# Patient Record
Sex: Female | Born: 1985 | Race: White | Hispanic: Yes | Marital: Single | Smoking: Never smoker
Health system: Southern US, Community
[De-identification: ages and names within clinical notes are randomized; demographics above are authoritative.]

## PROBLEM LIST (undated history)

## (undated) DIAGNOSIS — D649 Anemia, unspecified: Secondary | ICD-10-CM

## (undated) HISTORY — PX: APPENDECTOMY: SHX54

---

## 2008-07-01 ENCOUNTER — Ambulatory Visit (HOSPITAL_COMMUNITY): Admission: RE | Admit: 2008-07-01 | Discharge: 2008-07-01 | Payer: Self-pay | Admitting: Family Medicine

## 2008-07-08 ENCOUNTER — Ambulatory Visit: Payer: Self-pay | Admitting: Obstetrics & Gynecology

## 2008-07-22 ENCOUNTER — Ambulatory Visit: Payer: Self-pay | Admitting: Family Medicine

## 2008-07-22 ENCOUNTER — Encounter: Payer: Self-pay | Admitting: Family

## 2008-07-22 LAB — CONVERTED CEMR LAB
Chlamydia, DNA Probe: NEGATIVE
GC Probe Amp, Genital: NEGATIVE

## 2008-07-25 ENCOUNTER — Ambulatory Visit: Payer: Self-pay | Admitting: Obstetrics & Gynecology

## 2008-07-29 ENCOUNTER — Ambulatory Visit: Payer: Self-pay | Admitting: Obstetrics & Gynecology

## 2008-08-05 ENCOUNTER — Ambulatory Visit: Payer: Self-pay | Admitting: Family Medicine

## 2008-08-08 ENCOUNTER — Ambulatory Visit: Payer: Self-pay | Admitting: Obstetrics and Gynecology

## 2008-08-12 ENCOUNTER — Inpatient Hospital Stay (HOSPITAL_COMMUNITY): Admission: RE | Admit: 2008-08-12 | Discharge: 2008-08-14 | Payer: Self-pay | Admitting: Family Medicine

## 2008-08-12 ENCOUNTER — Ambulatory Visit: Payer: Self-pay | Admitting: Obstetrics & Gynecology

## 2008-10-02 ENCOUNTER — Ambulatory Visit: Payer: Self-pay | Admitting: Obstetrics and Gynecology

## 2008-12-10 ENCOUNTER — Emergency Department (HOSPITAL_COMMUNITY): Admission: EM | Admit: 2008-12-10 | Discharge: 2008-12-10 | Payer: Self-pay | Admitting: Emergency Medicine

## 2010-03-16 IMAGING — CR DG CHEST 1V PORT
1 series · 1 of 1 positions shown · non-contrast
Comparison: None

CLINICAL DATA: History given of rapid heart rate and chest pain.

PORTABLE CHEST - 1 VIEW

[AP]
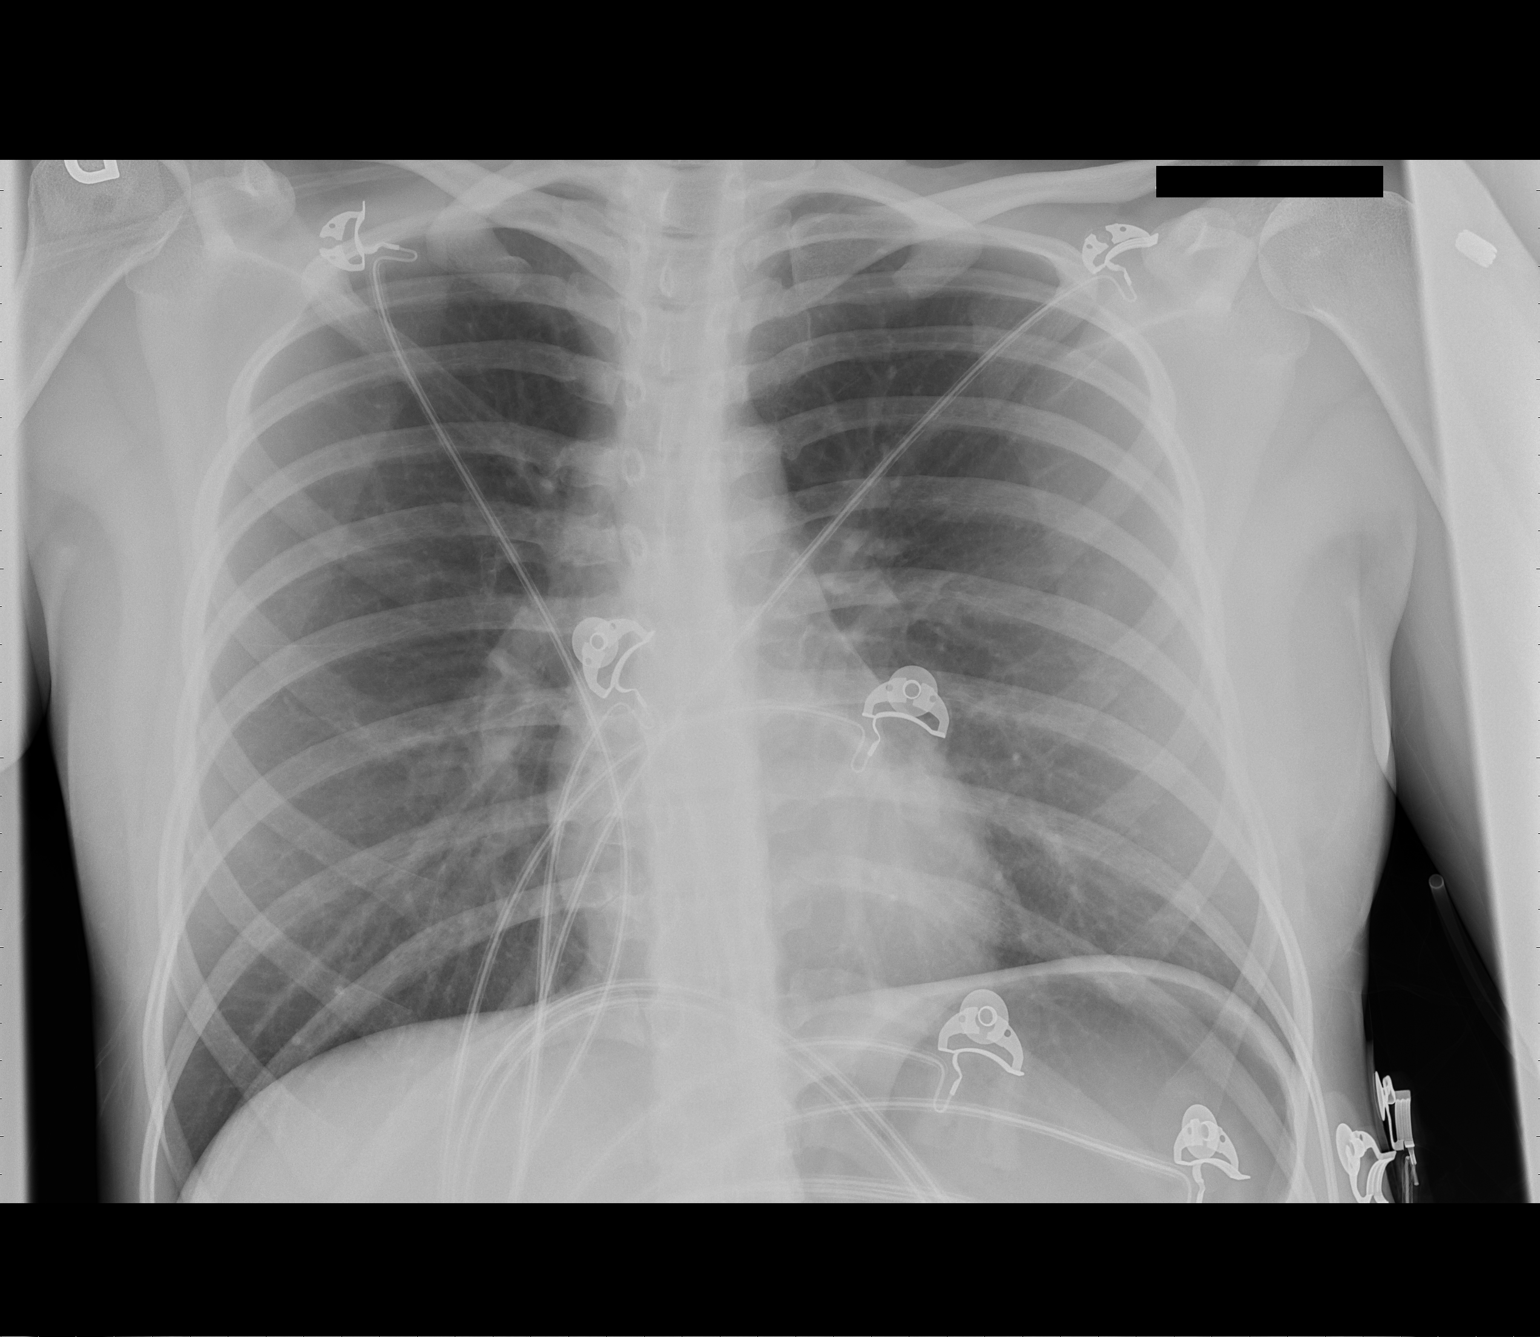

[1 of 1 positions shown; findings below may reference images not displayed]

FINDINGS: Cardiac silhouette is normal size and shape.  No pleural
disease is evident.  The lungs are free of infiltrates.  No
skeletal lesions are seen.  There is mild gaseous distention of the
stomach.
IMPRESSION: No acute or active cardiopulmonary process is identified.

## 2010-07-30 ENCOUNTER — Emergency Department (HOSPITAL_COMMUNITY): Admission: EM | Admit: 2010-07-30 | Discharge: 2010-03-23 | Payer: Self-pay | Admitting: Emergency Medicine

## 2010-09-13 ENCOUNTER — Encounter: Payer: Self-pay | Admitting: *Deleted

## 2010-11-06 LAB — URINALYSIS, ROUTINE W REFLEX MICROSCOPIC
Specific Gravity, Urine: 1.004 — ABNORMAL LOW (ref 1.005–1.030)
Urobilinogen, UA: 1 mg/dL (ref 0.0–1.0)

## 2010-11-06 LAB — BASIC METABOLIC PANEL
CO2: 26 mEq/L (ref 19–32)
Chloride: 102 mEq/L (ref 96–112)
GFR calc non Af Amer: >60 mL/min (ref >60)
Glucose, Bld: 103 mg/dL — ABNORMAL HIGH (ref 70–99)
Potassium: 3.7 mEq/L (ref 3.5–5.1)
Sodium: 137 mEq/L (ref 135–145)

## 2010-11-06 LAB — URINE MICROSCOPIC-ADD ON

## 2010-11-06 LAB — CBC
HCT: 37.8 % (ref 36.0–46.0)
Hemoglobin: 12.5 g/dL (ref 12.0–15.0)
MCHC: 33 g/dL (ref 30.0–36.0)
RDW: 15.6 % — ABNORMAL HIGH (ref 11.5–15.5)

## 2010-11-06 LAB — DIFFERENTIAL
Basophils Absolute: 0.1 10*3/uL (ref 0.0–0.1)
Eosinophils Relative: 1 % (ref 0–5)
Lymphs Abs: 3.3 10*3/uL (ref 0.7–4.0)
Monocytes Absolute: 1.2 10*3/uL — ABNORMAL HIGH (ref 0.1–1.0)
Monocytes Relative: 8 % (ref 3–12)
Neutro Abs: 10 10*3/uL — ABNORMAL HIGH (ref 1.7–7.7)

## 2010-12-02 LAB — RAPID URINE DRUG SCREEN, HOSP PERFORMED
Barbiturates: NOT DETECTED
Cocaine: POSITIVE — AB
Opiates: NOT DETECTED
Tetrahydrocannabinol: NOT DETECTED

## 2010-12-02 LAB — URINALYSIS, ROUTINE W REFLEX MICROSCOPIC
Bilirubin Urine: NEGATIVE
Ketones, ur: NEGATIVE mg/dL
Protein, ur: NEGATIVE mg/dL
Urobilinogen, UA: 1 mg/dL (ref 0.0–1.0)

## 2010-12-02 LAB — BASIC METABOLIC PANEL
CO2: 21 mEq/L (ref 19–32)
GFR calc Af Amer: >60 mL/min (ref >60)
Glucose, Bld: 125 mg/dL — ABNORMAL HIGH (ref 70–99)

## 2010-12-02 LAB — DIFFERENTIAL
Eosinophils Absolute: 0 10*3/uL (ref 0.0–0.7)
Eosinophils Relative: 0 % (ref 0–5)
Lymphocytes Relative: 21 % (ref 12–46)
Lymphs Abs: 1.6 10*3/uL (ref 0.7–4.0)
Monocytes Relative: 6 % (ref 3–12)
Neutrophils Relative %: 72 % (ref 43–77)

## 2010-12-02 LAB — URINE MICROSCOPIC-ADD ON

## 2010-12-02 LAB — POCT CARDIAC MARKERS
CKMB, poc: <1 ng/mL — ABNORMAL LOW (ref 1.0–8.0)
CKMB, poc: <1 ng/mL — ABNORMAL LOW (ref 1.0–8.0)
Myoglobin, poc: 22.2 ng/mL (ref 12–200)
Troponin i, poc: <0.05 ng/mL (ref 0.00–0.09)
Troponin i, poc: <0.05 ng/mL (ref 0.00–0.09)

## 2010-12-02 LAB — CBC: HCT: 42.4 % (ref 36.0–46.0)

## 2010-12-02 LAB — D-DIMER, QUANTITATIVE: D-Dimer, Quant: 0.82 ug/mL-FEU — ABNORMAL HIGH (ref 0.00–0.48)

## 2010-12-02 LAB — POCT PREGNANCY, URINE: Preg Test, Ur: NEGATIVE

## 2011-01-05 NOTE — Discharge Summary (Signed)
NAMESHERRAN, MARGOLIS      ACCOUNT NO.:  [id number]   MEDICAL RECORD NO.:  [id number]          PATIENT TYPE:  INP   LOCATION:  9120                          FACILITY:  [facility]   PHYSICIAN:  Tanya S. Shawnie Pons, M.D.   DATE OF BIRTH:  October 08, 1985   DATE OF ADMISSION:  08/12/2008  DATE OF DISCHARGE:  08/14/2008                               DISCHARGE SUMMARY   DIAGNOSIS AT ADMISSION:  Elective repeat low-transverse cesarean  section.   DIAGNOSIS AT DISCHARGE:  Term pregnancy delivered by low-transverse  cesarean section.   PROCEDURES DONE:  Nonstress test, low-transverse cesarean section.   HISTORY AND BRIEF HOSPITAL COURSE:  The patient is a 25 year old G3, P  now 3-0-0-3 at 39 weeks who elected for low-transverse cesarean section  went on to deliver a viable female at 7 pounds 0 ounces and 20 inches  long with Apgars of 9 at 1 minute and 9 at 5 minutes, had a uneventful  recovery and by discharge, was ambulatory, tolerating p.o. diet, voiding  freely and passing flatus.  Pain was controlled and lochia had subsided.   LABORATORY DATA:  Blood type A positive.  Antibody screen negative.  Rubella immune.  Hepatitis B surface antigen negative.  RPR nonreactive.  GC negative.  Chlamydia negative.  HIV nonreactive.  Glucose tolerance  test was 87 at 1 hour and GBS was negative.   ACTIVITY:  Pelvic rest for 6 weeks and no lifting of greater than 10  pounds.   DIET:  Routine.   MEDICATIONS AT DISCHARGE:  1. Prenatal vitamins 1 tablet p.o. daily.  2. Ibuprofen 600 mg p.o. q.6 h. p.r.n. pain.  3. Colace 100 mg p.o. b.i.d. p.r.n. constipation.  4. Percocet 5/325 one tablet p.o. q.6 h. p.r.n. pain.   DISCHARGE CONDITION:  Stable.   DISCHARGE DISPOSITION:  To home.   FOLLOWUP:  Followup will be in 6 weeks at the [facility]  Department.      Rodney Langton, MD  Electronically Signed     ______________________________  Shelbie Proctor. Shawnie Pons, M.D.    TT/MEDQ  D:   08/14/2008  T:  08/14/2008  Job:  [id number]

## 2011-01-05 NOTE — Op Note (Signed)
NAME:  Courtney Wang, Courtney Wang       ACCOUNT NO.:  [id number]   MEDICAL RECORD NO.:  [id number]          PATIENT TYPE:  INP   LOCATION:  9120                          [facility]ACILITY:  [facility]   PHYSICIAN:  Allie Bossier, MD        DATE OF BIRTH:  1985-11-09   DATE OF PROCEDURE:  DATE OF DISCHARGE:                               OPERATIVE REPORT   PREOPERATIVE DIAGNOSES:  1. Intrauterine pregnancy at 39 weeks.  2. History of previous cesarean section.   POSTOPERATIVE DIAGNOSES:  1. Intrauterine pregnancy at 39 weeks.  2. History of previous cesarean section.   PROCEDURE:  Repeat low transverse cesarean section.   SURGEON:  Myra C. Marice Potter, MD   ASSISTANT:  Odie Sera, DO   ANESTHESIA:  Spinal.   INDICATIONS FOR SURGERY:  Ms. Stahle is a 25 year old, gravida  3, now para 3-0-0-3, at 39-2/7th weeks for elective repeat low  transverse cesarean section.  She has had a previous cesarean section  secondary to fetal distress.  Her prenatal course has been complicated  by polyhydramnios, otherwise has been without complications.  She has  previously been counseled on the risks and benefits of repeat cesarean  section to include, but not limited to bleeding, infection, and damage  to internal organs.  The patient voices understanding of the  complications and desires to proceed with a repeat cesarean.   DESCRIPTION OF PROCEDURE:  The patient was taken to the operating room  where a spinal anesthesia was introduced.  She was prepped and draped in  the usual fashion and placed in the left dorsal supine position.  A time-  out was conducted.  A Pfannenstiel incision was made in the skin with a  scalpel and extended down to the fascial layer.  The fascia was incised  in the midline with the scalpel.  The fascial incision was extended  laterally with the Mayo scissors.  The rectus muscles were dissected in  the midline with electrocautery.  The rectus opening was extended  manually.  The  peritoneum was entered bluntly with a hemostat.  The  peritoneal opening was extended with electrocautery.  Adequate opening  to the uterus was obtained.  The bladder blade was placed.  A transverse  incision was made in the lower uterine segment with the scalpel.  This  was continued through the myometrium.  The final layers were entered  bluntly with a hemostat.  Copious amounts of clear amniotic fluid were  noted.  The uterine incision was extended laterally with manual  traction.  The bladder blade was removed.  The head was grasped,  elevated out of pelvis.  The head was delivered with the assistance of  fundal pressure.  The mouth and nares were bulb suctioned.  The  shoulders and rest of the corpus were delivered without complications.  The mouth and nares were bulb suctioned again.  The cord was clamped and  cut, and the baby was handed to the awaiting NICU staff.  Placenta  delivered with the assistance of uterine massage.  Placenta was intact  and a 3-vessel cord.  The baby's Apgars were 9 and 9  and weight was 7  pounds 0 ounces.  The uterus was cleared of debris and clots with a dry  lap.  The uterine incision was closed using 0 chromic in a running  interlocking fashion.  Good hemostasis was noted.  Bilateral adnexa were  identified and found to be grossly normal.  The fascia was then closed  with looped PDS suture in a running noninterlocking fashion.  Good  hemostasis was noted.  There were no defects noted in the fascia.  The  subcutaneous tissues were irrigated and injected with 0.25% Marcaine  approximately 30 mL.  The skin was then closed with 3-0 Vicryl in  running subcutaneous manner.  Steri-Strips were applied.   FINDINGS:  1. Clear amniotic fluid.  2. Viable female infant with Apgars 9 and 9, weight 7 pounds 0 ounces.  3. Grossly normal adnexa bilaterally.   SPECIMENS:  1. Cord blood.  2. Placenta.   DISPOSITION:  Cord blood to the lab.  Placenta to L and  D.   ESTIMATED BLOOD LOSS:  600 mL.   COMPLICATIONS:  None.  The patient was taken to the PACU in good  condition.      Odie Sera, DO  Electronically Signed     ______________________________  Allie Bossier, MD    MC/MEDQ  D:  08/12/2008  T:  08/12/2008  Job:  [id number]

## 2011-05-25 LAB — POCT URINALYSIS DIP (DEVICE)
Bilirubin Urine: NEGATIVE
Hgb urine dipstick: NEGATIVE
Ketones, ur: NEGATIVE
Nitrite: NEGATIVE
Operator id: 148111
Urobilinogen, UA: 0.2
pH: 7
pH: 7.5

## 2011-05-28 LAB — TYPE AND SCREEN
ABO/RH(D): A POS
Antibody Screen: NEGATIVE

## 2011-05-28 LAB — POCT URINALYSIS DIP (DEVICE)
Bilirubin Urine: NEGATIVE
Glucose, UA: NEGATIVE mg/dL
Glucose, UA: NEGATIVE mg/dL
Hgb urine dipstick: NEGATIVE
Ketones, ur: NEGATIVE mg/dL
Nitrite: NEGATIVE
Protein, ur: NEGATIVE mg/dL
Urobilinogen, UA: 0.2 mg/dL (ref 0.0–1.0)
pH: 7 (ref 5.0–8.0)
pH: 7 (ref 5.0–8.0)

## 2011-05-28 LAB — CBC
Hemoglobin: 11.3 g/dL — ABNORMAL LOW (ref 12.0–15.0)
MCHC: 34.5 g/dL (ref 30.0–36.0)
Platelets: 221 10*3/uL (ref 150–400)
RDW: 14.9 % (ref 11.5–15.5)
RDW: 15.1 % (ref 11.5–15.5)
WBC: 12.3 10*3/uL — ABNORMAL HIGH (ref 4.0–10.5)

## 2011-05-28 LAB — ABO/RH: ABO/RH(D): A POS

## 2011-06-27 IMAGING — CT CT HEAD W/O CM
2 series · 15 of 30 positions shown, 19 images · non-contrast
Comparison: None.

CLINICAL DATA: Bilateral hand and facial numbness.

CT HEAD WITHOUT CONTRAST
TECHNIQUE: Contiguous axial images were obtained from the base of
the skull through the vertex without contrast.

[Series 2: brain · axial · 0.49mm/px · z∈[+134,+240]mm · 13 of 48 slices shown, 17 images]
[im 4/48  brain]
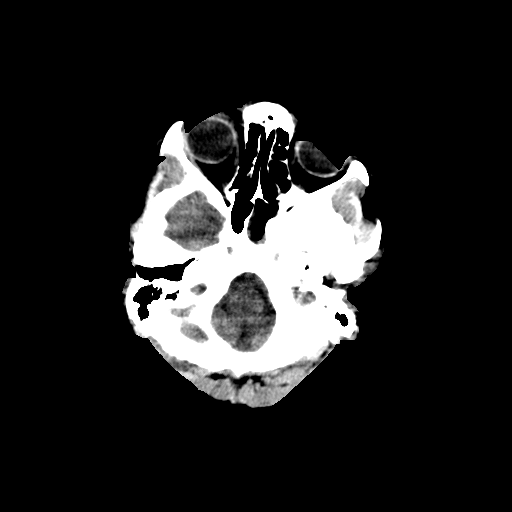
[im 4/48  bone]
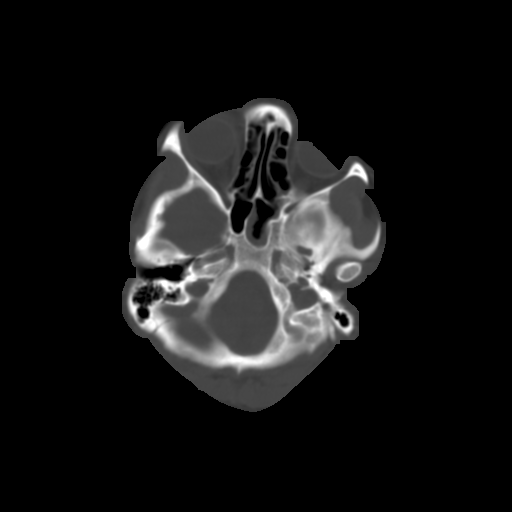
[im 7/48  brain]
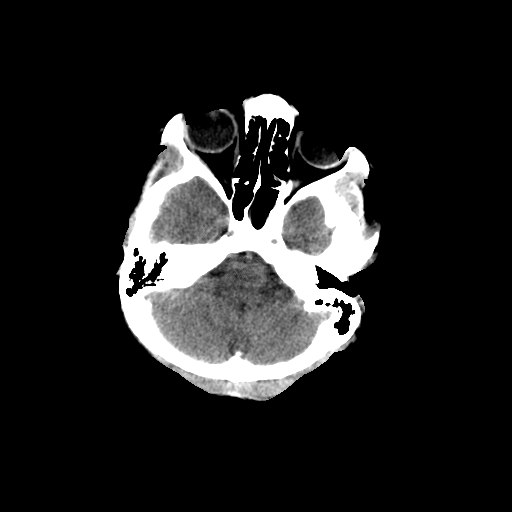
[im 11/48  brain]
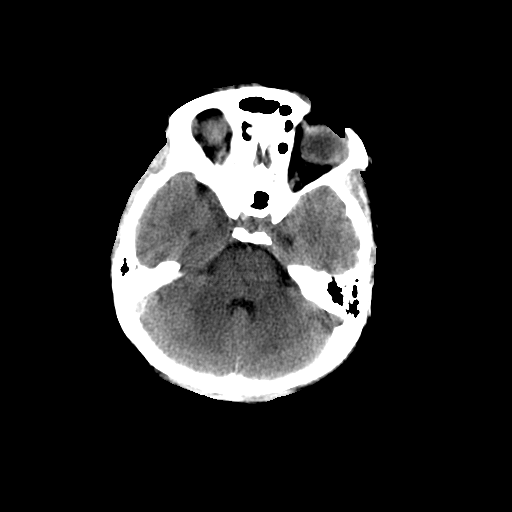
[im 14/48  brain]
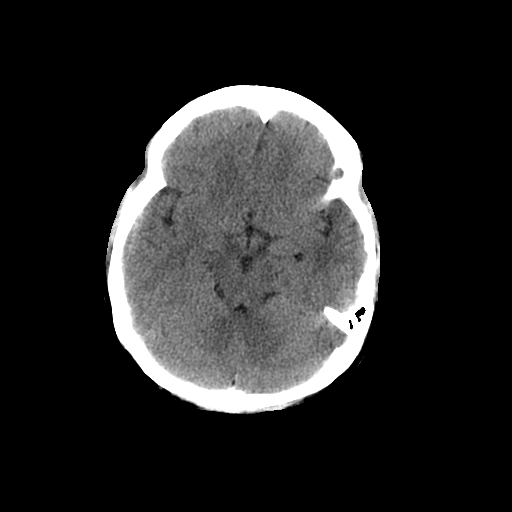
[im 17/48  brain]
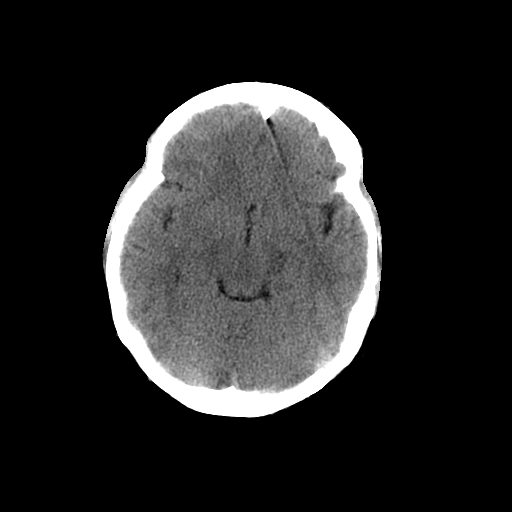
[im 17/48  bone]
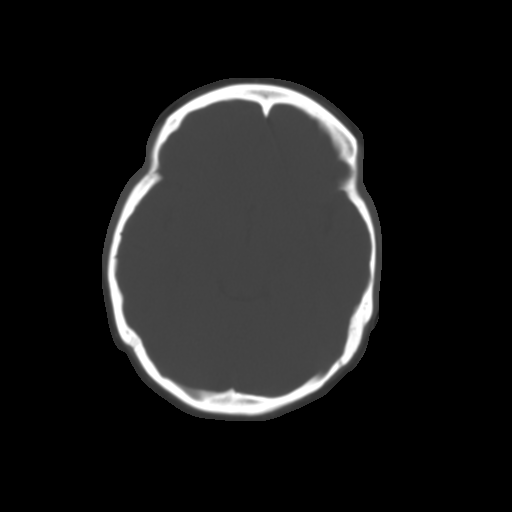
[im 21/48  brain]
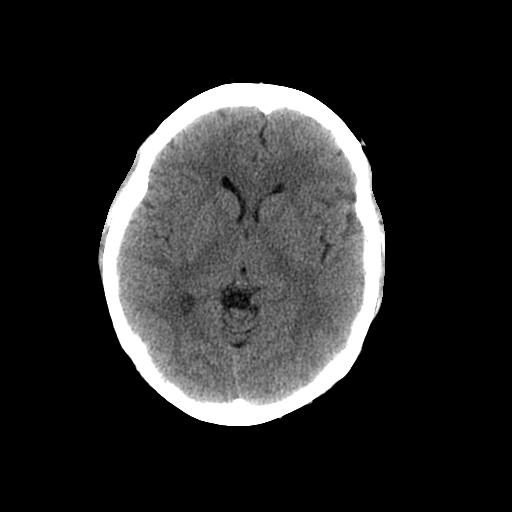
[im 24/48  brain]
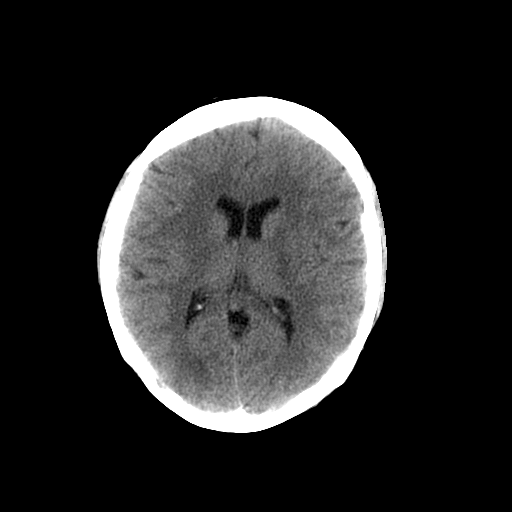
[im 27/48  brain]
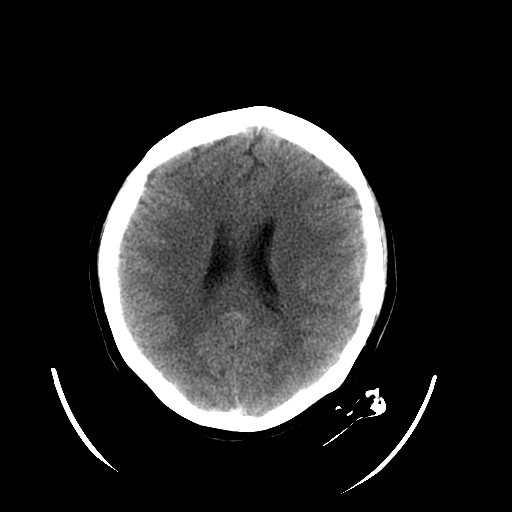
[im 31/48  brain]
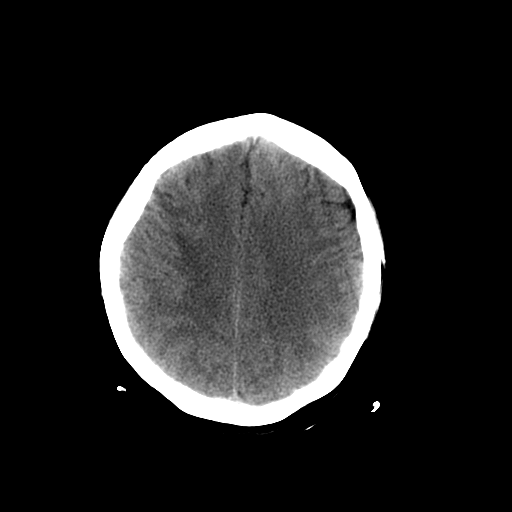
[im 31/48  bone]
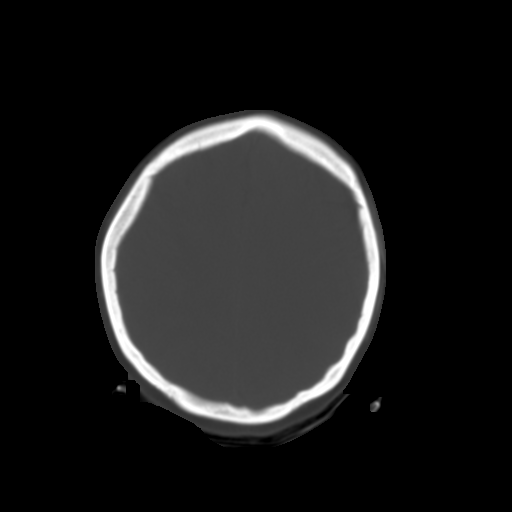
[im 34/48  brain]
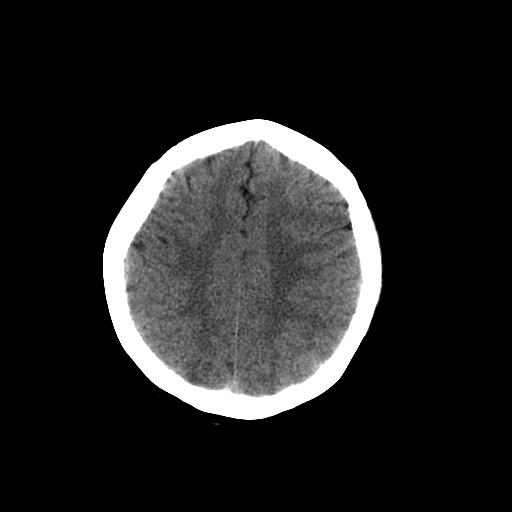
[im 37/48  brain]
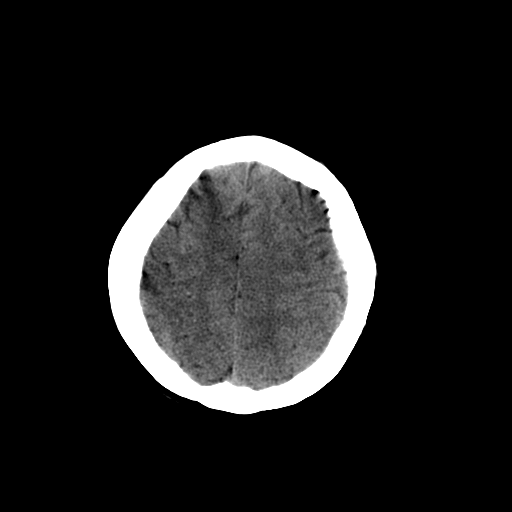
[im 41/48  brain]
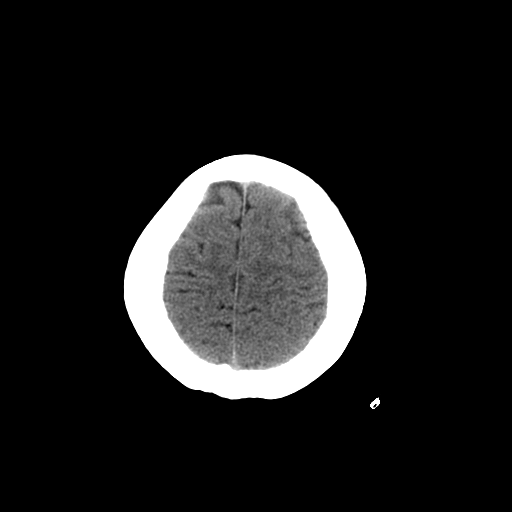
[im 44/48  brain]
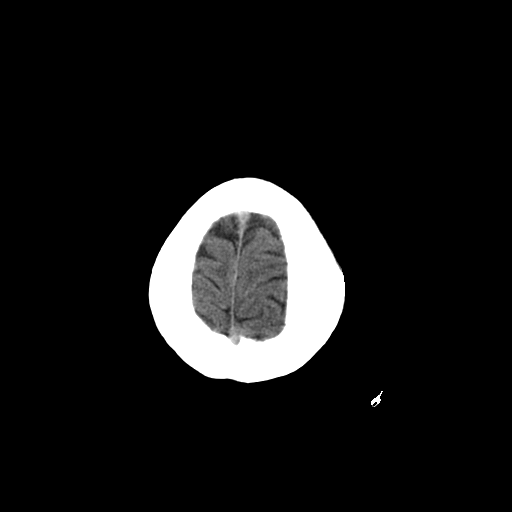
[im 44/48  bone]
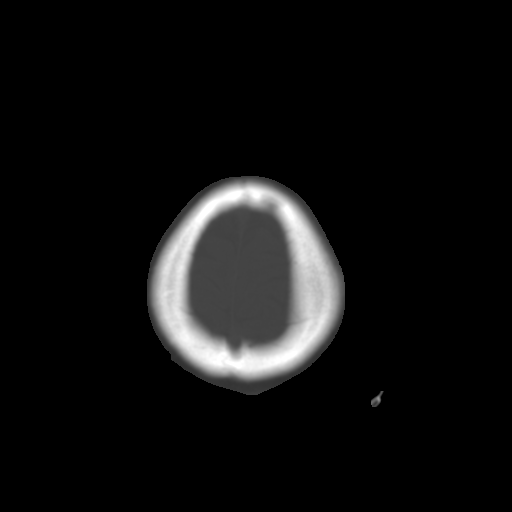

[Series 3: recon 2: brain · axial · 0.49mm/px · z∈[+134,+154]mm · 2 of 48 slices shown]
[im 4/48  brain]
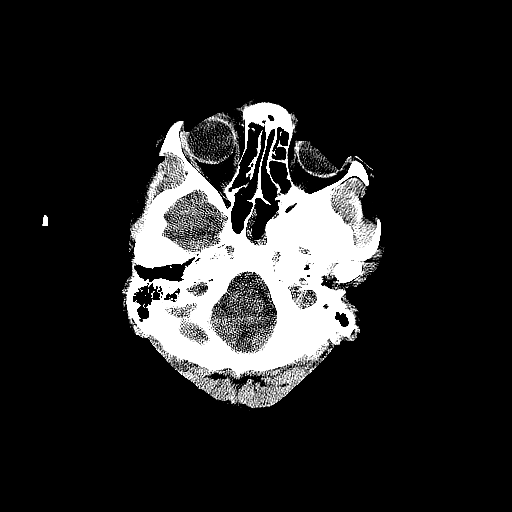
[im 11/48  brain]
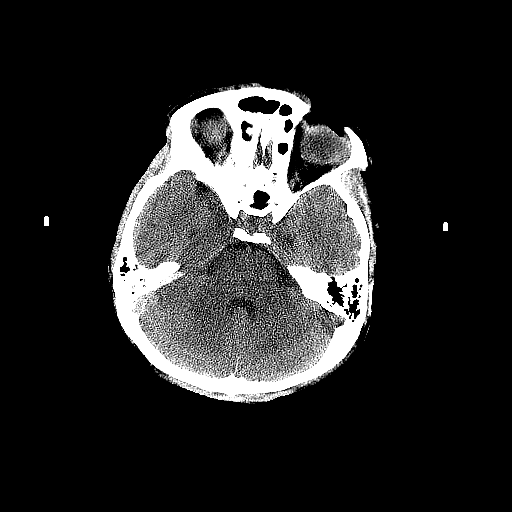

[15 of 30 positions shown; findings below may reference images not displayed]

FINDINGS: There is no evidence of acute infarction, mass lesion, or
intra- or extra-axial hemorrhage on CT. Evaluation is mildly
suboptimal due to motion artifact.

The posterior fossa, including the cerebellum, brainstem and fourth
ventricle, is within normal limits.  The third and lateral
ventricles, and basal ganglia are unremarkable in appearance.  The
cerebral hemispheres are symmetric in appearance, with normal gray-
white differentiation.  No mass effect or midline shift is seen.

There is no evidence of fracture; visualized osseous structures are
unremarkable in appearance.  The visualized portions of the orbits
are within normal limits.  The paranasal sinuses and mastoid air
cells are well-aerated.  There is hypoplasia of the frontal
sinuses.  No significant soft tissue abnormalities are seen.
IMPRESSION: Unremarkable noncontrast CT of the head.

## 2021-12-21 ENCOUNTER — Emergency Department (HOSPITAL_COMMUNITY)
Admission: EM | Admit: 2021-12-21 | Discharge: 2021-12-22 | Disposition: A | Discharge status: Home / Self Care | Payer: Self-pay | Attending: Emergency Medicine | Admitting: Emergency Medicine

## 2021-12-21 ENCOUNTER — Other Ambulatory Visit: Payer: Self-pay

## 2021-12-21 DIAGNOSIS — B3731 Acute candidiasis of vulva and vagina: Secondary | ICD-10-CM | POA: Insufficient documentation

## 2021-12-21 DIAGNOSIS — R21 Rash and other nonspecific skin eruption: Secondary | ICD-10-CM | POA: Insufficient documentation

## 2021-12-21 DIAGNOSIS — R5383 Other fatigue: Secondary | ICD-10-CM | POA: Insufficient documentation

## 2021-12-21 DIAGNOSIS — B9689 Other specified bacterial agents as the cause of diseases classified elsewhere: Secondary | ICD-10-CM | POA: Insufficient documentation

## 2021-12-21 DIAGNOSIS — A6004 Herpesviral vulvovaginitis: Secondary | ICD-10-CM

## 2021-12-21 DIAGNOSIS — N760 Acute vaginitis: Secondary | ICD-10-CM | POA: Insufficient documentation

## 2021-12-21 DIAGNOSIS — R0602 Shortness of breath: Secondary | ICD-10-CM | POA: Insufficient documentation

## 2021-12-21 DIAGNOSIS — E876 Hypokalemia: Secondary | ICD-10-CM | POA: Insufficient documentation

## 2021-12-21 DIAGNOSIS — R519 Headache, unspecified: Secondary | ICD-10-CM | POA: Insufficient documentation

## 2021-12-21 DIAGNOSIS — E871 Hypo-osmolality and hyponatremia: Secondary | ICD-10-CM | POA: Insufficient documentation

## 2021-12-21 DIAGNOSIS — B0089 Other herpesviral infection: Secondary | ICD-10-CM | POA: Insufficient documentation

## 2021-12-21 LAB — CBC
HCT: 25.3 % — ABNORMAL LOW (ref 36.0–46.0)
Hemoglobin: 6.4 g/dL — CL (ref 12.0–15.0)
MCH: 15.8 pg — ABNORMAL LOW (ref 26.0–34.0)
MCHC: 25.3 g/dL — ABNORMAL LOW (ref 30.0–36.0)
MCV: 62.3 fL — ABNORMAL LOW (ref 80.0–100.0)
Platelets: 517 10*3/uL — ABNORMAL HIGH (ref 150–400)
RBC: 4.06 MIL/uL (ref 3.87–5.11)
RDW: 19.7 % — ABNORMAL HIGH (ref 11.5–15.5)
WBC: 13.4 10*3/uL — ABNORMAL HIGH (ref 4.0–10.5)
nRBC: 0 % (ref 0.0–0.2)

## 2021-12-21 LAB — URINALYSIS, ROUTINE W REFLEX MICROSCOPIC
Bilirubin Urine: NEGATIVE
Glucose, UA: NEGATIVE mg/dL
Hgb urine dipstick: NEGATIVE
Ketones, ur: NEGATIVE mg/dL
Nitrite: NEGATIVE
Protein, ur: NEGATIVE mg/dL
Specific Gravity, Urine: 1.014 (ref 1.005–1.030)
pH: 6 (ref 5.0–8.0)

## 2021-12-21 LAB — BASIC METABOLIC PANEL
Anion gap: 10 (ref 5–15)
BUN: 12 mg/dL (ref 6–20)
CO2: 22 mmol/L (ref 22–32)
Calcium: 9.6 mg/dL (ref 8.9–10.3)
Chloride: 102 mmol/L (ref 98–111)
Creatinine, Ser: 0.73 mg/dL (ref 0.44–1.00)
GFR, Estimated: >60 mL/min (ref >60)
Glucose, Bld: 104 mg/dL — ABNORMAL HIGH (ref 70–99)
Potassium: 3.4 mmol/L — ABNORMAL LOW (ref 3.5–5.1)
Sodium: 134 mmol/L — ABNORMAL LOW (ref 135–145)

## 2021-12-21 NOTE — ED Provider Triage Note (Signed)
Emergency Medicine Provider Triage Evaluation Note ? ?Courtney Wang , a 36 y.o. female  was evaluated in triage.  Pt complains of "pimples" on her vagina for the last 2 weeks.  Patient states that she has multiple pimples that drained yellow fluid on her vagina.  The patient reports that this has been going on for the last 2 weeks.  The patient denies any fevers however does endorse body aches and chills.  The patient endorses an episode of nausea however no vomiting.  Patient denies any pelvic pain, vaginal discharge. ? ?Review of Systems  ?Positive:  ?Negative:  ? ?Physical Exam  ?BP 123/76 (BP Location: Right Arm)   Pulse (!) 106   Temp 98.4 ?F (36.9 ?C) (Oral)   Resp 16   SpO2 100%  ?Gen:   Awake, no distress   ?Resp:  Normal effort  ?MSK:   Moves extremities without difficulty  ?Other:   ? ?Medical Decision Making  ?Medically screening exam initiated at 8:52 PM.  Appropriate orders placed.  Courtney Wang was informed that the remainder of the evaluation will be completed by another provider, this initial triage assessment does not replace that evaluation, and the importance of remaining in the ED until their evaluation is complete. ? ? ?  ?Al Decant, PA-C ?12/21/21 2053 ? ?

## 2021-12-21 NOTE — ED Triage Notes (Signed)
Pt presents reporting a one week hx of "blisters" around her vagina that are too painful to even sit down.  Also reports chills. No hx of this in the past.  ?

## 2021-12-22 LAB — WET PREP, GENITAL
Clue Cells Wet Prep HPF POC: NONE SEEN
Sperm: NONE SEEN
Trich, Wet Prep: NONE SEEN
WBC, Wet Prep HPF POC: >=10 — AB (ref <10)

## 2021-12-22 LAB — GC/CHLAMYDIA PROBE AMP (~~LOC~~) NOT AT ARMC
Chlamydia: NEGATIVE
Comment: NEGATIVE
Comment: NORMAL
Neisseria Gonorrhea: NEGATIVE

## 2021-12-22 LAB — PREGNANCY, URINE: Preg Test, Ur: NEGATIVE

## 2021-12-22 LAB — PREPARE RBC (CROSSMATCH)

## 2021-12-22 LAB — ABO/RH: ABO/RH(D): A POS

## 2021-12-22 MED ORDER — SODIUM CHLORIDE 0.9 % IV SOLN
1.0000 g | Freq: Once | INTRAVENOUS | Status: AC
  Administered 2021-12-22: 1 g via INTRAVENOUS
  Filled 2021-12-22: qty 10

## 2021-12-22 MED ORDER — DOXYCYCLINE HYCLATE 100 MG PO CAPS: 100.0000 mg | ORAL_CAPSULE | Freq: Two times a day (BID) | ORAL | 0 refills | Status: AC

## 2021-12-22 MED ORDER — DOXYCYCLINE HYCLATE 100 MG PO TABS
100.0000 mg | ORAL_TABLET | Freq: Once | ORAL | Status: AC
  Administered 2021-12-22: 100 mg via ORAL
  Filled 2021-12-22: qty 1

## 2021-12-22 MED ORDER — FLUCONAZOLE 150 MG PO TABS
150.0000 mg | ORAL_TABLET | Freq: Once | ORAL | Status: AC
  Administered 2021-12-22: 150 mg via ORAL
  Filled 2021-12-22: qty 1

## 2021-12-22 MED ORDER — SODIUM CHLORIDE 0.9 % IV SOLN
10.0000 mL/h | Freq: Once | INTRAVENOUS | Status: AC
  Administered 2021-12-22: 10 mL/h via INTRAVENOUS

## 2021-12-22 MED ORDER — FENTANYL CITRATE PF 50 MCG/ML IJ SOSY
50.0000 ug | PREFILLED_SYRINGE | Freq: Once | INTRAMUSCULAR | Status: AC
  Administered 2021-12-22: 50 ug via INTRAVENOUS
  Filled 2021-12-22: qty 1

## 2021-12-22 MED ORDER — VALACYCLOVIR HCL 500 MG PO TABS
1000.0000 mg | ORAL_TABLET | Freq: Once | ORAL | Status: AC
  Administered 2021-12-22: 1000 mg via ORAL
  Filled 2021-12-22: qty 2

## 2021-12-22 MED ORDER — VALACYCLOVIR HCL 1 G PO TABS: 1000.0000 mg | ORAL_TABLET | Freq: Two times a day (BID) | ORAL | 0 refills | Status: AC

## 2021-12-22 NOTE — Discharge Instructions (Addendum)
You were seen in the ER today for your discomfort in your vaginal area.  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  This is being treated with valacyclovir, which you should take 2 times a day for the next 2 weeks.  Additionally you are being treated with an antibiotic to cover for any other sexually transmitted intravaginal infections called doxycycline.  Please take this as prescribed for the entire course.  You were also treated in the emergency department with a dose of medication for a yeast infection in your vaginal area for which she tested positive. ? ?Follow-up with the gynecologist listed below.  Please call their office tomorrow to schedule follow-up appointment for evaluation for your very heavy periods; these will need to be appropriately managed in order to prevent recurrence of your profound anemia. ? ?Return to the ER if you develop any other new severe symptoms.  You may also use Tylenol and ibuprofen as needed for your vaginal discomfort. ? ?Usted fue vista en la sala de emergencias hoy por su malestar en el ?rea vaginal. Tiene una infecci?n de herpes, que es una infecci?n viral que causa ampollas en el ?rea vaginal. Esto se est? tratando con valaciclovir, que debe tomar 2 veces al d?a durante las pr?ximas 2 semanas. Adem?s, est? siendo tratada con un antibi?tico para cubrir cualquier otra infecci?n intravaginal de transmisi?n sexual llamada doxiciclina. Por favor, tome esto seg?n lo prescrito para todo el curso. Tambi?n te trataron en el departamento de emergencias con una dosis de medicamento para una infecci?n por hongos en tu ?rea vaginal en la que ella dio positivo. ? ?Seguimiento con el ginec?logo que se indica a continuaci?n. Llame a su oficina ma?ana para programar una cita de seguimiento para la evaluaci?n de sus per?odos muy abundantes; estos deber?n manejarse adecuadamente para prevenir la recurrencia de su anemia  profunda. ? ?Regrese a la sala de emergencias si desarrolla cualquier otro s?ntoma grave nuevo. Tambi?n puede usar Tylenol e ibuprofeno seg?n sea necesario para su malestar vaginal. ?

## 2021-12-22 NOTE — ED Notes (Signed)
Lab agreed to add on Pregnancy urine  ?

## 2021-12-22 NOTE — ED Notes (Signed)
Pt verbalizes understanding of discharge instructions. Opportunity for questions and answers were provided. Pt discharged from the ED.   ?

## 2021-12-22 NOTE — ED Provider Notes (Signed)
Care assumed from [place], [place] at shift change, please see their note for full detail, but in brief Courtney Wang is a 36 y.o. female presents with symptomatic anemia from vaginal bleeding. ? ?Plan: Discharge after blood transfusion ?Physical Exam  ?BP 103/70   Pulse 64   Temp 97.7 ?F (36.5 ?C) (Oral)   Resp 15   SpO2 100%  ? ?Physical Exam ? ?Procedures  ?Procedures ? ?ED Course / MDM  ? ?Clinical Course as of 12/22/21 0645  ?Tue Dec 22, 2021  ?[id number] Patient's first unit of blood is being transfused at this time; will require second unit and antibiotic therapy prior to discharge but plan is to discharge from the ED at this time. [RS]  ?  ?Clinical Course User Index ?[RS] Sponseller, Eugene Gavia, PA-C  ? ?Medical Decision Making ?Amount and/or Complexity of Data Reviewed ?Labs: ordered. ? ?Risk ?Prescription drug management. ? ? ?Pt received 2 units of prbc and is feeling much better. Patient states that the heavy bleeding occurs while she is on her period but is not currently having her period at this time. Denies active bleeding. 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 Pt provided referral for OBGYN f/u and instructed to call today to establish appt for tomorrow for recheck. Spanish interpreter used. Pt expresses understanding and amenable to plan. Discharged home in good condition. ? ? ? ? ?  ?Janell Quiet, [place] ?12/22/21 1042 ? ?  ?Gwyneth Sprout, MD ?12/22/21 1539 ? ?

## 2021-12-22 NOTE — ED Provider Notes (Signed)
?[facility] ?Provider Note ? ? ?CSN: [id number] ?Arrival date & time: 12/21/21  2001 ?  ? ?History ? ?Chief Complaint  ?Patient presents with  ? Vaginal Pain  ? ?Courtney Wang is a 36 y.o. female who presents with her family at the bedside with concern for blisters and burning sensation in her vaginal area for the last 2 weeks.  States that there were "pimples or blisters" that drained yellowish fluid, and also that she has been having some body aches and chills.  Endorses increase in vaginal discharge as well stating that she soaks her underwear for the last week secondary to this.  Denies any vaginal bleeding.  She does state that typically when she has her periods she has been bleeds heavily, soaking through pads as often as every 10 minutes.  States that she was anemic with her children.  Has not seen a doctor since the birth of her youngest child. ? ?Pain associated with blisters radiates to the right inguinal fold.  ? ?She does not carry medical diagnoses nor she on any medications daily.  She denies any melena or hematochezia, denies any rectal bleeding or vaginal bleeding outside of during her menstrual period. ? ?She does endorse headaches, fatigue, and shortness of breath with exertion for the last couple of weeks; most recent.  Approximately 3 weeks ago.   I have personally reviewed this patient's chart. ? ?HPI ? ?  ? ?Home Medications ?Prior to Admission medications   ?Medication Sig Start Date End Date Taking? Authorizing Provider  ?acetaminophen (TYLENOL) 500 MG tablet Take 1,000 mg by mouth every 6 (six) hours as needed for moderate pain or headache.   Yes [provider]  ?doxycycline (VIBRAMYCIN) 100 MG capsule Take 1 capsule (100 mg total) by mouth 2 (two) times daily for 7 days. 12/22/21 12/29/21 Yes Golden Emile, Gypsy Balsam, PA-C  ?valACYclovir (VALTREX) 1000 MG tablet Take 1 tablet (1,000 mg total) by mouth 2 (two) times daily for 14 days. 12/22/21 01/05/22  Yes Milo Schreier, Gypsy Balsam, PA-C  ?   ? ?Allergies    ?Patient has no known allergies.   ? ?Review of Systems   ?Review of Systems  ?Constitutional:  Positive for chills.  ?HENT: Negative.    ?Respiratory: Negative.    ?Cardiovascular: Negative.   ?Gastrointestinal: Negative.   ?Genitourinary:  Positive for dysuria, genital sores, vaginal discharge and vaginal pain. Negative for decreased urine volume, flank pain, frequency, hematuria, menstrual problem, pelvic pain, urgency and vaginal bleeding.  ?Musculoskeletal:  Positive for myalgias.  ?Neurological: Negative.   ? ?Physical Exam ?Updated Vital Signs ?BP 103/64   Pulse 73   Temp 97.6 ?F (36.4 ?C) (Oral)   Resp 16   SpO2 100%  ?Physical Exam ?Vitals and nursing note reviewed. Exam conducted with a chaperone present.  ?Constitutional:   ?   Appearance: She is not ill-appearing or toxic-appearing.  ?HENT:  ?   Head: Normocephalic and atraumatic.  ?   Nose: Nose normal.  ?   Mouth/Throat:  ?   Mouth: Mucous membranes are moist.  ?   Pharynx: No oropharyngeal exudate or posterior oropharyngeal erythema.  ?Eyes:  ?   General:     ?   Right eye: No discharge.     ?   Left eye: No discharge.  ?   Extraocular Movements: Extraocular movements intact.  ?   Conjunctiva/sclera: Conjunctivae normal.  ?   Pupils: Pupils are equal, round, and reactive to light.  ?Cardiovascular:  ?  Rate and Rhythm: Normal rate and regular rhythm.  ?   Pulses: Normal pulses.  ?   Heart sounds: Normal heart sounds. No murmur heard. ?Pulmonary:  ?   Effort: Pulmonary effort is normal. No respiratory distress.  ?   Breath sounds: Normal breath sounds. No wheezing or rales.  ?Abdominal:  ?   General: Bowel sounds are normal. There is no distension.  ?   Palpations: Abdomen is soft.  ?   Tenderness: There is no abdominal tenderness. There is no guarding or rebound.  ?Genitourinary: ?   Exam position: Supine.  ?   Labia:     ?   Right: Rash, tenderness and lesion present.     ?   Left: Rash,  tenderness and lesion present.   ?   Vagina: Vaginal discharge present. No tenderness, bleeding or lesions.  ?   Uterus: Normal.   ?   Adnexa: Right adnexa normal and left adnexa normal.  ?   Comments: Vesicular rash on the labia bilaterally, right greater than left most consistent with herpetic eruption. ? ?Thick white and yellow discharge present in the vaginal vault and moderate amount.  No adnexal fullness or tenderness palpation.  Cervix unable to be visualized due to patient's exquisite pain with manipulation of the speculum during exam, secondary to herpetic lesions on the labia minora. ?Musculoskeletal:     ?   General: No deformity.  ?   Cervical back: Neck supple.  ?Lymphadenopathy:  ?   Lower Body: Right inguinal adenopathy present.  ?Skin: ?   General: Skin is warm and dry.  ?   Capillary Refill: Capillary refill takes less than 2 seconds.  ?   Findings: Rash present. Rash is vesicular.  ?Neurological:  ?   General: No focal deficit present.  ?   Mental Status: She is alert. Mental status is at baseline.  ?   GCS: GCS eye subscore is 4. GCS verbal subscore is 5. GCS motor subscore is 6.  ?   Gait: Gait is intact.  ?Psychiatric:     ?   Mood and Affect: Mood normal.  ? ? ?ED Results / Procedures / Treatments   ?Labs ?(all labs ordered are listed, but only abnormal results are displayed) ?Labs Reviewed  ?WET PREP, GENITAL - Abnormal; Notable for the following components:  ?    Result Value  ? Yeast Wet Prep HPF POC PRESENT (*)   ? WBC, Wet Prep HPF POC >=10 (*)   ? All other components within normal limits  ?CBC - Abnormal; Notable for the following components:  ? WBC 13.4 (*)   ? Hemoglobin 6.4 (*)   ? HCT 25.3 (*)   ? MCV 62.3 (*)   ? MCH 15.8 (*)   ? MCHC 25.3 (*)   ? RDW 19.7 (*)   ? Platelets 517 (*)   ? All other components within normal limits  ?BASIC METABOLIC PANEL - Abnormal; Notable for the following components:  ? Sodium 134 (*)   ? Potassium 3.4 (*)   ? Glucose, Bld 104 (*)   ? All other  components within normal limits  ?URINALYSIS, ROUTINE W REFLEX MICROSCOPIC - Abnormal; Notable for the following components:  ? APPearance HAZY (*)   ? Leukocytes,Ua LARGE (*)   ? Bacteria, UA FEW (*)   ? All other components within normal limits  ?HSV CULTURE AND TYPING  ?PREGNANCY, URINE  ?PREPARE RBC (CROSSMATCH)  ?TYPE AND SCREEN  ?PREPARE RBC (CROSSMATCH)  ?  GC/CHLAMYDIA PROBE AMP ([place]) NOT AT [facility]  ? ? ?EKG ?None ? ?Radiology ?No results found. ? ?Procedures ?Marland KitchenCritical Care ?Performed by: Emeline Darling, PA-C ?Authorized by: Emeline Darling, PA-C  ? ?Critical care provider statement:  ?  Critical care time (minutes):  45 ?  Critical care was time spent personally by me on the following activities:  Development of treatment plan with patient or surrogate, discussions with consultants, evaluation of patient's response to treatment, examination of patient, obtaining history from patient or surrogate, ordering and performing treatments and interventions, ordering and review of laboratory studies, ordering and review of radiographic studies, pulse oximetry and re-evaluation of patient's condition  ? ? ?Medications Ordered in ED ?Medications  ?0.9 %  sodium chloride infusion (0 mL/hr Intravenous Hold 12/22/21 0421)  ?cefTRIAXone (ROCEPHIN) 1 g in sodium chloride 0.9 % 100 mL IVPB (has no administration in time range)  ?valACYclovir (VALTREX) tablet 1,000 mg (1,000 mg Oral Given 12/22/21 0337)  ?fluconazole (DIFLUCAN) tablet 150 mg (150 mg Oral Given 12/22/21 0337)  ?fentaNYL (SUBLIMAZE) injection 50 mcg (50 mcg Intravenous Given 12/22/21 0335)  ?doxycycline (VIBRA-TABS) tablet 100 mg (100 mg Oral Given 12/22/21 0559)  ? ? ?ED Course/ Medical Decision Making/ A&P ?Clinical Course as of 12/22/21 0627  ?Tue Dec 22, 2021  ?[id number] Patient's first unit of blood is being transfused at this time; will require second unit and antibiotic therapy prior to discharge but plan is to discharge from the ED at this time.  [RS]  ?  ?Clinical Course User Index ?[RS] Smiley Birr, Gypsy Balsam, PA-C  ? ?                        ?Medical Decision Making ?36 year old female presents with concern for painful rash to the vaginal area for 2 weeks, bu

## 2021-12-23 LAB — TYPE AND SCREEN
ABO/RH(D): A POS
Antibody Screen: NEGATIVE
Unit division: 0
Unit division: 0

## 2021-12-23 LAB — BPAM RBC
Blood Product Expiration Date: 202305232359
Blood Product Expiration Date: 202305232359
ISSUE DATE / TIME: 202305020356
ISSUE DATE / TIME: 202305020657
Unit Type and Rh: 6200
Unit Type and Rh: 6200

## 2023-07-12 ENCOUNTER — Emergency Department (HOSPITAL_BASED_OUTPATIENT_CLINIC_OR_DEPARTMENT_OTHER)
Admission: EM | Admit: 2023-07-12 | Discharge: 2023-07-12 | Disposition: A | Discharge status: Home / Self Care | Payer: Self-pay | Attending: Emergency Medicine | Admitting: Emergency Medicine

## 2023-07-12 ENCOUNTER — Encounter (HOSPITAL_BASED_OUTPATIENT_CLINIC_OR_DEPARTMENT_OTHER): Payer: Self-pay | Admitting: Urology

## 2023-07-12 ENCOUNTER — Other Ambulatory Visit: Payer: Self-pay

## 2023-07-12 DIAGNOSIS — D509 Iron deficiency anemia, unspecified: Secondary | ICD-10-CM | POA: Insufficient documentation

## 2023-07-12 DIAGNOSIS — D72829 Elevated white blood cell count, unspecified: Secondary | ICD-10-CM | POA: Insufficient documentation

## 2023-07-12 DIAGNOSIS — D649 Anemia, unspecified: Secondary | ICD-10-CM

## 2023-07-12 HISTORY — DX: Anemia, unspecified: D64.9

## 2023-07-12 LAB — COMPREHENSIVE METABOLIC PANEL WITH GFR
ALT: 14 U/L (ref 0–44)
AST: 20 U/L (ref 15–41)
Albumin: 4.1 g/dL (ref 3.5–5.0)
Alkaline Phosphatase: 46 U/L (ref 38–126)
Anion gap: 8 (ref 5–15)
BUN: 13 mg/dL (ref 6–20)
CO2: 22 mmol/L (ref 22–32)
Calcium: 9.4 mg/dL (ref 8.9–10.3)
Chloride: 104 mmol/L (ref 98–111)
Creatinine, Ser: 0.69 mg/dL (ref 0.44–1.00)
GFR, Estimated: >60 mL/min
Glucose, Bld: 98 mg/dL (ref 70–99)
Potassium: 3.8 mmol/L (ref 3.5–5.1)
Sodium: 134 mmol/L — ABNORMAL LOW (ref 135–145)
Total Bilirubin: 0.5 mg/dL
Total Protein: 7.6 g/dL (ref 6.5–8.1)

## 2023-07-12 LAB — CBC WITH DIFFERENTIAL/PLATELET
Abs Immature Granulocytes: 0.04 10*3/uL (ref 0.00–0.07)
Basophils Absolute: 0.1 10*3/uL (ref 0.0–0.1)
Basophils Relative: 1 %
Eosinophils Absolute: 0.2 10*3/uL (ref 0.0–0.5)
Eosinophils Relative: 2 %
HCT: 28.3 % — ABNORMAL LOW (ref 36.0–46.0)
Hemoglobin: 7.9 g/dL — ABNORMAL LOW (ref 12.0–15.0)
Immature Granulocytes: 0 %
Lymphocytes Relative: 24 %
Lymphs Abs: 2.8 10*3/uL (ref 0.7–4.0)
MCH: 18.2 pg — ABNORMAL LOW (ref 26.0–34.0)
MCHC: 27.9 g/dL — ABNORMAL LOW (ref 30.0–36.0)
MCV: 65.4 fL — ABNORMAL LOW (ref 80.0–100.0)
Monocytes Absolute: 0.9 10*3/uL (ref 0.1–1.0)
Monocytes Relative: 8 %
Neutro Abs: 7.4 10*3/uL (ref 1.7–7.7)
Neutrophils Relative %: 65 %
Platelets: 406 10*3/uL — ABNORMAL HIGH (ref 150–400)
RBC: 4.33 MIL/uL (ref 3.87–5.11)
RDW: 18.6 % — ABNORMAL HIGH (ref 11.5–15.5)
WBC: 11.4 10*3/uL — ABNORMAL HIGH (ref 4.0–10.5)
nRBC: 0 % (ref 0.0–0.2)

## 2023-07-12 MED ORDER — FERROUS SULFATE 325 (65 FE) MG PO TABS
325.0000 mg | ORAL_TABLET | Freq: Every day | ORAL | 0 refills | Status: AC
Start: 2023-07-12 — End: ?

## 2023-07-12 MED ORDER — NAPROXEN 500 MG PO TABS
500.0000 mg | ORAL_TABLET | Freq: Two times a day (BID) | ORAL | 0 refills | Status: AC
Start: 2023-07-12 — End: ?

## 2023-07-12 NOTE — ED Provider Notes (Signed)
[facility] Provider Note   CSN: [id number] Arrival date & time: 07/12/23  1902     History  Chief Complaint  Patient presents with   Fatigue    Courtney Wang is a 37 y.o. female history significant for anemia here for evaluation of Fatigue.  Patient states she has history of anemia due to heavy menstrual cycles.  She states over the last 3 months she has had persistent fatigue, myalgias as well as generalized weakness.  She will occasionally have lightheadedness when she goes from sitting to standing.  She states this feels consistent with her prior anemia requiring blood transfusion.  She is supposed to be taking iron however is not taking this.  She states she occasionally gets headaches, does not have any current headache.  No vision changes, numbness, weakness, chest pain, shortness of breath, Donnell pain, nausea or vomiting.  She is wonder if she can get a blood transfusion here to help with her symptoms.    HPI     Home Medications Prior to Admission medications   Medication Sig Start Date End Date Taking? Authorizing Provider  ferrous sulfate 325 (65 FE) MG tablet Take 1 tablet (325 mg total) by mouth daily. 07/12/23  Yes Seon Gaertner A, PA-C  naproxen (NAPROSYN) 500 MG tablet Take 1 tablet (500 mg total) by mouth 2 (two) times daily. 07/12/23  Yes Knolan Simien A, PA-C  acetaminophen (TYLENOL) 500 MG tablet Take 1,000 mg by mouth every 6 (six) hours as needed for moderate pain or headache.    [provider]      Allergies    Patient has no known allergies.    Review of Systems   Review of Systems  Constitutional:  Positive for fatigue.  HENT: Negative.    Respiratory: Negative.    Cardiovascular: Negative.   Gastrointestinal: Negative.   Genitourinary: Negative.  Difficulty urinating: intermittent, none currently.  Musculoskeletal: Negative.   Skin:  Positive for pallor.  Neurological:  Positive for  light-headedness and headaches. Facial asymmetry: intermittent, none currently. All other systems reviewed and are negative.   Physical Exam Updated Vital Signs BP 116/82   Pulse 72   Temp 98.6 F (37 C)   Resp 19   Ht 5' (1.524 m)   Wt 46.7 kg   LMP 06/21/2023   SpO2 100%   BMI 20.12 kg/m  Physical Exam Vitals and nursing note reviewed.  Constitutional:      General: She is not in acute distress.    Appearance: She is well-developed. She is not ill-appearing, toxic-appearing or diaphoretic.  HENT:     Head: Normocephalic and atraumatic.     Nose: Nose normal.     Mouth/Throat:     Mouth: Mucous membranes are moist.  Eyes:     Pupils: Pupils are equal, round, and reactive to light.  Cardiovascular:     Rate and Rhythm: Normal rate.     Pulses: Normal pulses.     Heart sounds: Normal heart sounds.  Pulmonary:     Effort: Pulmonary effort is normal. No respiratory distress.     Breath sounds: Normal breath sounds.  Abdominal:     General: Bowel sounds are normal. There is no distension.     Palpations: Abdomen is soft.     Tenderness: There is no abdominal tenderness.     Hernia: No hernia is present.  Musculoskeletal:        General: No swelling or tenderness. Normal  range of motion.     Cervical back: Normal range of motion.     Right lower leg: No edema.     Left lower leg: No edema.  Skin:    General: Skin is warm and dry.     Capillary Refill: Capillary refill takes less than 2 seconds.     Coloration: Skin is pale.  Neurological:     General: No focal deficit present.     Mental Status: She is alert and oriented to person, place, and time.     Cranial Nerves: No cranial nerve deficit.     Sensory: No sensory deficit.     Motor: No weakness.     Gait: Gait normal.  Psychiatric:        Mood and Affect: Mood normal.     ED Results / Procedures / Treatments   Labs (all labs ordered are listed, but only abnormal results are displayed) Labs Reviewed   CBC WITH DIFFERENTIAL/PLATELET - Abnormal; Notable for the following components:      Result Value   WBC 11.4 (*)    Hemoglobin 7.9 (*)    HCT 28.3 (*)    MCV 65.4 (*)    MCH 18.2 (*)    MCHC 27.9 (*)    RDW 18.6 (*)    Platelets 406 (*)    All other components within normal limits  COMPREHENSIVE METABOLIC PANEL - Abnormal; Notable for the following components:   Sodium 134 (*)    All other components within normal limits    EKG None  Radiology No results found.  Procedures Procedures    Medications Ordered in ED Medications - No data to display  ED Course/ Medical Decision Making/ A&P   37 year old here for evaluation of fatigue.  She has baseline heavy menstrual cycles and has required transfusions in the past.  She has some intermittent fatigue over the last 3 months, generalized weakness.  She has a nonfocal neuroexam without deficits.  Does note she gets intermittent headaches however does not have one currently.  No recent head trauma.  She appears otherwise well however does appear mildly pale.  She is not currently bleeding.  She states she was given naproxen in the past which helped with her headaches.  Requesting refill.  Will check basic labs.  DO not feel she needs additional imaging at this time.  Labs personally viewed and interpreted:  CBC leukocytosis 11.4, hemoglobin 7.9, does appear to have iron deficiency anemia Metabolic panel sodium 134  I discussed results with patient, family in room.  I will start her on iron and I referred her to an OB/GYN to help with her heavy menstrual cycles.  Do not feel she needs emergent transfer to facility with blood products for emergent transfusion or admission to hospital at this time.  Naproxen prescription written for her intermittent headaches.  Will have her follow-up outpatient, return for any worsening symptoms.  The patient has been appropriately medically screened and/or stabilized in the ED. I have low  suspicion for any other emergent medical condition which would require further screening, evaluation or treatment in the ED or require inpatient management.  Patient is hemodynamically stable and in no acute distress.  Patient able to ambulate in department prior to ED.  Evaluation does not show acute pathology that would require ongoing or additional emergent interventions while in the emergency department or further inpatient treatment.  I have discussed the diagnosis with the patient and answered all questions.  Pain is  been managed while in the emergency department and patient has no further complaints prior to discharge.  Patient is comfortable with plan discussed in room and is stable for discharge at this time.  I have discussed strict return precautions for returning to the emergency department.  Patient was encouraged to follow-up with PCP/specialist refer to at discharge.                                  Medical Decision Making Amount and/or Complexity of Data Reviewed Independent Historian:     Details: Family in room External Data Reviewed: labs and notes. Labs: ordered. Decision-making details documented in ED Course.  Risk OTC drugs. Prescription drug management. Decision regarding hospitalization. Diagnosis or treatment significantly limited by social determinants of health.          Final Clinical Impression(s) / ED Diagnoses Final diagnoses:  Anemia, unspecified type    Rx / DC Orders ED Discharge Orders          Ordered    Ambulatory referral to Obstetrics / Gynecology        07/12/23 2144    ferrous sulfate 325 (65 FE) MG tablet  Daily        07/12/23 2144    naproxen (NAPROSYN) 500 MG tablet  2 times daily        07/12/23 2144              Chanceler Pullin A, PA-C 07/12/23 2210    Glyn Ade, MD 07/14/23 1530

## 2023-07-12 NOTE — Discharge Instructions (Addendum)
Le he iniciado el tratamiento con suplementos de hierro para ayudarle con su anemia. Esto ayudar a aumentar sus recuentos sanguneos. (sulfato ferroso/hierro)  [employer] he escrito para Theatre stage manager algunos medicamentos que le ayudarn si le duele la cabeza. (naproxeno)  Haga un seguimiento con un obstetra para que le ayude con sus ciclos menstruales abundantes.  Regrese si los sntomas son nuevos o Psychologist, prison and probation services

## 2023-07-12 NOTE — ED Triage Notes (Signed)
Spanish Interpreter needed   Pt states she feels like she may be anemic, had blood transfusion 3 months ago for same  States feels same as last time that started approx 1 month ago states body aches and fatigue, pt is pale in color as well

## 2024-02-01 ENCOUNTER — Encounter (HOSPITAL_BASED_OUTPATIENT_CLINIC_OR_DEPARTMENT_OTHER): Payer: Self-pay

## 2024-02-01 ENCOUNTER — Other Ambulatory Visit: Payer: Self-pay

## 2024-02-01 DIAGNOSIS — O99619 Diseases of the digestive system complicating pregnancy, unspecified trimester: Secondary | ICD-10-CM | POA: Insufficient documentation

## 2024-02-01 DIAGNOSIS — K5900 Constipation, unspecified: Secondary | ICD-10-CM | POA: Insufficient documentation

## 2024-02-01 DIAGNOSIS — O99019 Anemia complicating pregnancy, unspecified trimester: Secondary | ICD-10-CM | POA: Insufficient documentation

## 2024-02-01 NOTE — ED Triage Notes (Signed)
 Complaining of bloating that started a week and a half ago. Does not eat and she has not had a bowel movement either for a week and a half.

## 2024-02-02 ENCOUNTER — Other Ambulatory Visit: Payer: Self-pay | Admitting: Obstetrics and Gynecology

## 2024-02-02 ENCOUNTER — Emergency Department (HOSPITAL_BASED_OUTPATIENT_CLINIC_OR_DEPARTMENT_OTHER)
Admission: EM | Admit: 2024-02-02 | Discharge: 2024-02-02 | Disposition: A | Discharge status: Home / Self Care | Payer: Self-pay | Attending: Emergency Medicine | Admitting: Emergency Medicine

## 2024-02-02 DIAGNOSIS — O0932 Supervision of pregnancy with insufficient antenatal care, second trimester: Secondary | ICD-10-CM | POA: Insufficient documentation

## 2024-02-02 DIAGNOSIS — O0992 Supervision of high risk pregnancy, unspecified, second trimester: Secondary | ICD-10-CM | POA: Insufficient documentation

## 2024-02-02 DIAGNOSIS — Z331 Pregnant state, incidental: Secondary | ICD-10-CM

## 2024-02-02 DIAGNOSIS — D649 Anemia, unspecified: Secondary | ICD-10-CM | POA: Insufficient documentation

## 2024-02-02 DIAGNOSIS — O34219 Maternal care for unspecified type scar from previous cesarean delivery: Secondary | ICD-10-CM | POA: Insufficient documentation

## 2024-02-02 DIAGNOSIS — O09522 Supervision of elderly multigravida, second trimester: Secondary | ICD-10-CM | POA: Insufficient documentation

## 2024-02-02 DIAGNOSIS — K5900 Constipation, unspecified: Secondary | ICD-10-CM

## 2024-02-02 DIAGNOSIS — Z603 Acculturation difficulty: Secondary | ICD-10-CM | POA: Insufficient documentation

## 2024-02-02 LAB — CBC
HCT: 22.9 % — ABNORMAL LOW (ref 36.0–46.0)
Hemoglobin: 6.2 g/dL — CL (ref 12.0–15.0)
MCH: 19 pg — ABNORMAL LOW (ref 26.0–34.0)
MCHC: 27.1 g/dL — ABNORMAL LOW (ref 30.0–36.0)
MCV: 70.2 fL — ABNORMAL LOW (ref 80.0–100.0)
Platelets: 385 10*3/uL (ref 150–400)
RBC: 3.26 MIL/uL — ABNORMAL LOW (ref 3.87–5.11)
RDW: 21.5 % — ABNORMAL HIGH (ref 11.5–15.5)
WBC: 13.6 10*3/uL — ABNORMAL HIGH (ref 4.0–10.5)
nRBC: 0.3 % — ABNORMAL HIGH (ref 0.0–0.2)

## 2024-02-02 LAB — IRON AND TIBC
Iron: 18 ug/dL — ABNORMAL LOW (ref 28–170)
Saturation Ratios: 3 % — ABNORMAL LOW (ref 10.4–31.8)
TIBC: 694 ug/dL — ABNORMAL HIGH (ref 250–450)
UIBC: 676 ug/dL

## 2024-02-02 LAB — COMPREHENSIVE METABOLIC PANEL WITH GFR
ALT: 8 U/L (ref 0–44)
AST: 19 U/L (ref 15–41)
Albumin: 3.9 g/dL (ref 3.5–5.0)
Alkaline Phosphatase: 68 U/L (ref 38–126)
Anion gap: 15 (ref 5–15)
BUN: <5 mg/dL — ABNORMAL LOW (ref 6–20)
CO2: 18 mmol/L — ABNORMAL LOW (ref 22–32)
Calcium: 9.3 mg/dL (ref 8.9–10.3)
Chloride: 105 mmol/L (ref 98–111)
Creatinine, Ser: 0.51 mg/dL (ref 0.44–1.00)
GFR, Estimated: >60 mL/min (ref >60)
Glucose, Bld: 74 mg/dL (ref 70–99)
Potassium: 3.3 mmol/L — ABNORMAL LOW (ref 3.5–5.1)
Sodium: 137 mmol/L (ref 135–145)
Total Bilirubin: 0.2 mg/dL (ref 0.0–1.2)
Total Protein: 7.3 g/dL (ref 6.5–8.1)

## 2024-02-02 LAB — URINALYSIS, ROUTINE W REFLEX MICROSCOPIC
Bilirubin Urine: NEGATIVE
Glucose, UA: NEGATIVE mg/dL
Hgb urine dipstick: NEGATIVE
Ketones, ur: NEGATIVE mg/dL
Nitrite: NEGATIVE
Protein, ur: NEGATIVE mg/dL
Specific Gravity, Urine: 1.025 (ref 1.005–1.030)
pH: 6 (ref 5.0–8.0)

## 2024-02-02 LAB — URINALYSIS, MICROSCOPIC (REFLEX)

## 2024-02-02 LAB — RETICULOCYTES
Immature Retic Fract: 37.6 % — ABNORMAL HIGH (ref 2.3–15.9)
RBC.: 3.29 MIL/uL — ABNORMAL LOW (ref 3.87–5.11)
Retic Count, Absolute: 114.8 10*3/uL (ref 19.0–186.0)
Retic Ct Pct: 3.5 % — ABNORMAL HIGH (ref 0.4–3.1)

## 2024-02-02 LAB — FERRITIN: Ferritin: 5 ng/mL — ABNORMAL LOW (ref 11–307)

## 2024-02-02 LAB — FOLATE: Folate: 21.3 ng/mL (ref >5.9)

## 2024-02-02 LAB — VITAMIN B12: Vitamin B-12: 156 pg/mL — ABNORMAL LOW (ref 180–914)

## 2024-02-02 LAB — LIPASE, BLOOD: Lipase: 55 U/L — ABNORMAL HIGH (ref 11–51)

## 2024-02-02 LAB — PREGNANCY, URINE: Preg Test, Ur: POSITIVE — AB

## 2024-02-02 MED ORDER — POLYETHYLENE GLYCOL 3350 17 G PO PACK: 17.0000 g | PACK | Freq: Every day | ORAL | 0 refills | Status: AC

## 2024-02-02 MED ORDER — CEFADROXIL 500 MG PO CAPS: 500.0000 mg | ORAL_CAPSULE | Freq: Two times a day (BID) | ORAL | 0 refills | Status: AC

## 2024-02-02 MED ORDER — POLYSACCHARIDE IRON COMPLEX 150 MG PO CAPS: 150.0000 mg | ORAL_CAPSULE | ORAL | 0 refills | Status: AC

## 2024-02-02 NOTE — Discharge Instructions (Addendum)
Start taking a prenatal vitamin, available over the counter

## 2024-02-02 NOTE — ED Provider Notes (Signed)
 [facility] Provider Note   CSN: [id number] Arrival date & time: 02/01/24  2243     History  Chief Complaint  Patient presents with   Abdominal Pain    Courtney Wang is a 38 y.o. female.  The history is provided by the patient and medical records. A language interpreter was used.  Abdominal Pain Courtney Wang is a 38 y.o. female who presents to the Emergency Department complaining of abdominal pain.  She presents to the emergency department for evaluation of abdominal bloating and discomfort for the last 3 weeks.  She has decreased appetite.  No nausea or vomiting.  No dysuria.  She feels like she is peeing less.  No vaginal discharge.  No new sexual partners.  She has a history of anemia.  LMP is unknown.  She did have some bleeding around her birthday.  She has had 3 prior normal pregnancies.  She did last receive a blood transfusion 2 years ago.  She does not currently have a PCP or OB.     Home Medications Prior to Admission medications   Medication Sig Start Date End Date Taking? Authorizing Provider  iron polysaccharides (NIFEREX) 150 MG capsule Take 1 capsule (150 mg total) by mouth every other day. 02/02/24  Yes Kelsey Patricia, MD  polyethylene glycol (MIRALAX) 17 g packet Take 17 g by mouth daily. 02/02/24  Yes Kelsey Patricia, MD  acetaminophen (TYLENOL) 500 MG tablet Take 1,000 mg by mouth every 6 (six) hours as needed for moderate pain or headache.    [provider]  ferrous sulfate  325 (65 FE) MG tablet Take 1 tablet (325 mg total) by mouth daily. 07/12/23   Henderly, Britni A, PA-C  naproxen  (NAPROSYN ) 500 MG tablet Take 1 tablet (500 mg total) by mouth 2 (two) times daily. 07/12/23   Henderly, Britni A, PA-C      Allergies    Patient has no known allergies.    Review of Systems   Review of Systems  Gastrointestinal:  Positive for abdominal pain.  All other systems reviewed and are negative.   Physical  Exam Updated Vital Signs BP 97/66   Pulse 92   Temp 98.5 F (36.9 C) (Oral)   Resp 16   Ht 5' (1.524 m)   Wt 49.9 kg   LMP 01/18/2024   SpO2 100%   BMI 21.48 kg/m  Physical Exam Vitals and nursing note reviewed.  Constitutional:      Appearance: She is well-developed.  HENT:     Head: Normocephalic and atraumatic.   Cardiovascular:     Rate and Rhythm: Normal rate and regular rhythm.     Heart sounds: No murmur heard. Pulmonary:     Effort: Pulmonary effort is normal. No respiratory distress.     Breath sounds: Normal breath sounds.  Abdominal:     Palpations: Abdomen is soft.     Tenderness: There is no guarding or rebound.     Comments: Gravid uterus to just above the umbilicus   Musculoskeletal:        General: No tenderness.   Skin:    General: Skin is warm and dry.   Neurological:     Mental Status: She is alert and oriented to person, place, and time.   Psychiatric:        Behavior: Behavior normal.     ED Results / Procedures / Treatments   Labs (all labs ordered are listed, but only abnormal results are  displayed) Labs Reviewed  LIPASE, BLOOD - Abnormal; Notable for the following components:      Result Value   Lipase 55 (*)    All other components within normal limits  COMPREHENSIVE METABOLIC PANEL WITH GFR - Abnormal; Notable for the following components:   Potassium 3.3 (*)    CO2 18 (*)    BUN <5 (*)    All other components within normal limits  CBC - Abnormal; Notable for the following components:   WBC 13.6 (*)    RBC 3.26 (*)    Hemoglobin 6.2 (*)    HCT 22.9 (*)    MCV 70.2 (*)    MCH 19.0 (*)    MCHC 27.1 (*)    RDW 21.5 (*)    nRBC 0.3 (*)    All other components within normal limits  URINALYSIS, ROUTINE W REFLEX MICROSCOPIC - Abnormal; Notable for the following components:   APPearance HAZY (*)    Leukocytes,Ua TRACE (*)    All other components within normal limits  PREGNANCY, URINE - Abnormal; Notable for the following  components:   Preg Test, Ur POSITIVE (*)    All other components within normal limits  URINALYSIS, MICROSCOPIC (REFLEX) - Abnormal; Notable for the following components:   Bacteria, UA MANY (*)    All other components within normal limits  RETICULOCYTES - Abnormal; Notable for the following components:   Retic Ct Pct 3.5 (*)    RBC. 3.29 (*)    Immature Retic Fract 37.6 (*)    All other components within normal limits  VITAMIN B12  FOLATE  IRON AND TIBC  FERRITIN    EKG EKG Interpretation Date/Time:  Thursday February 02 2024 01:51:01 EDT Ventricular Rate:  98 PR Interval:  97 QRS Duration:  85 QT Interval:  370 QTC Calculation: 473 R Axis:   79  Text Interpretation: Sinus rhythm Short PR interval Minimal ST depression, inferior leads Confirmed by Kelsey Patricia [phone]) on 02/02/2024 2:06:57 AM  Radiology No results found.  Procedures Procedures    Medications Ordered in ED Medications - No data to display  ED Course/ Medical Decision Making/ A&P                                 Medical Decision Making Amount and/or Complexity of Data Reviewed Labs: ordered.  Risk OTC drugs.   Patient is a G4, P3 at unknown dates.  Bedside ultrasound with possible biparietal diameter of 24 weeks.  She does measure to just above her umbilicus.  No contractions or evidence of active labor.  She was unaware of pregnancy until this ED presentation.  She does not have any abdominal tenderness.  Current clinical picture is not consistent with cholecystitis, pancreatitis.  Pt has asymptomatic bacteuria - will start on abx and send culture.  Formal US  not available at this time for confirmation of dates on her pregnancy.  No evidence of active labor.  Discussed with Dr. Aquilla Knapp with OB/GYN given patient's finding of pregnancy, undetermined gestational age, patient's anemia and inability to transfuse at this time.  She does not appear to be symptomatic at this time.  Recommendation for initiating  oral iron with close follow-up with OB and hematology-he will arrange referral so patient can get prompt outpatient follow-up.  Discussed with patient close return precautions for any signs of labor or symptomatic anemia as well as any signs or symptoms of bleeding.  Final Clinical Impression(s) / ED Diagnoses Final diagnoses:  Anemia, unspecified type  Constipation, unspecified constipation type  Pregnancy as incidental finding    Rx / DC Orders ED Discharge Orders          Ordered    polyethylene glycol (MIRALAX) 17 g packet  Daily        02/02/24 0304    iron polysaccharides (NIFEREX) 150 MG capsule  Every other day        02/02/24 0304              Kelsey Patricia, MD 02/02/24 [phone]

## 2024-02-02 NOTE — ED Notes (Signed)
 Fetal heart tones completed at this time.  HR 142.  MD made aware

## 2024-02-03 LAB — URINE CULTURE: Culture: NO GROWTH
